# Patient Record
Sex: Female | Born: 1975 | Race: White | Hispanic: No | Marital: Married | Smoking: Never smoker
Health system: Southern US, Community
[De-identification: ages and names within clinical notes are randomized; demographics above are authoritative.]

## PROBLEM LIST (undated history)

## (undated) DIAGNOSIS — M549 Dorsalgia, unspecified: Secondary | ICD-10-CM

## (undated) DIAGNOSIS — R011 Cardiac murmur, unspecified: Secondary | ICD-10-CM

## (undated) DIAGNOSIS — R3129 Other microscopic hematuria: Secondary | ICD-10-CM

## (undated) HISTORY — DX: Dorsalgia, unspecified: M54.9

## (undated) HISTORY — DX: Other microscopic hematuria: R31.29

## (undated) HISTORY — PX: TUBAL LIGATION: SHX77

## (undated) HISTORY — DX: Cardiac murmur, unspecified: R01.1

---

## 2012-12-29 ENCOUNTER — Encounter: Payer: Self-pay | Admitting: Family Medicine

## 2013-01-08 ENCOUNTER — Encounter: Payer: Self-pay | Admitting: Family Medicine

## 2014-08-30 ENCOUNTER — Ambulatory Visit: Payer: Self-pay | Admitting: Family Medicine

## 2014-10-14 ENCOUNTER — Ambulatory Visit: Payer: Self-pay | Admitting: Gastroenterology

## 2014-11-22 DIAGNOSIS — D509 Iron deficiency anemia, unspecified: Secondary | ICD-10-CM | POA: Insufficient documentation

## 2015-02-08 ENCOUNTER — Other Ambulatory Visit: Payer: Self-pay | Admitting: Family Medicine

## 2015-02-09 ENCOUNTER — Other Ambulatory Visit: Payer: Self-pay | Admitting: Family Medicine

## 2015-02-09 DIAGNOSIS — Z1231 Encounter for screening mammogram for malignant neoplasm of breast: Secondary | ICD-10-CM

## 2015-02-17 ENCOUNTER — Ambulatory Visit: Payer: Self-pay

## 2015-02-18 ENCOUNTER — Ambulatory Visit
Admission: RE | Admit: 2015-02-18 | Discharge: 2015-02-18 | Disposition: A | Discharge status: Home / Self Care | Payer: Medicaid Other | Source: Ambulatory Visit | Attending: Family Medicine | Admitting: Family Medicine

## 2015-02-18 DIAGNOSIS — Z1231 Encounter for screening mammogram for malignant neoplasm of breast: Secondary | ICD-10-CM

## 2015-03-21 ENCOUNTER — Encounter: Payer: Self-pay | Admitting: *Deleted

## 2015-03-21 ENCOUNTER — Ambulatory Visit (INDEPENDENT_AMBULATORY_CARE_PROVIDER_SITE_OTHER): Payer: Medicaid Other | Admitting: Urology

## 2015-03-21 VITALS — BP 113/75 | HR 80 | Ht 65.0 in | Wt 182.2 lb

## 2015-03-21 DIAGNOSIS — T7840XA Allergy, unspecified, initial encounter: Secondary | ICD-10-CM | POA: Insufficient documentation

## 2015-03-21 DIAGNOSIS — N390 Urinary tract infection, site not specified: Secondary | ICD-10-CM | POA: Diagnosis not present

## 2015-03-21 DIAGNOSIS — R8281 Pyuria: Secondary | ICD-10-CM

## 2015-03-21 DIAGNOSIS — R312 Other microscopic hematuria: Secondary | ICD-10-CM

## 2015-03-21 DIAGNOSIS — R8271 Bacteriuria: Secondary | ICD-10-CM

## 2015-03-21 DIAGNOSIS — R3129 Other microscopic hematuria: Secondary | ICD-10-CM

## 2015-03-21 LAB — URINALYSIS, COMPLETE
Bilirubin, UA: NEGATIVE
GLUCOSE, UA: NEGATIVE
Leukocytes, UA: NEGATIVE
NITRITE UA: NEGATIVE
PROTEIN UA: NEGATIVE
RBC UA: NEGATIVE
Specific Gravity, UA: 1.025 (ref 1.005–1.030)
Urobilinogen, Ur: 1 mg/dL (ref 0.2–1.0)
pH, UA: 6 (ref 5.0–7.5)

## 2015-03-21 LAB — MICROSCOPIC EXAMINATION: Bacteria, UA: NONE SEEN

## 2015-03-21 NOTE — Progress Notes (Signed)
Patient ID: Tammy Pace, female   DOB: 08/10/1976, 39 y.o.   MRN: [id number]   03/21/2015 10:47 AM   Tammy BrickKatherine Pace 08/10/1976 [id number]  Referring provider: Titus MouldElizabeth Burney White, NP [address] [address] [place], [place] [id number]  Chief Complaint  Patient presents with  . Hematuria    referral from Doristine MangoElizabeth White NP    HPI: Tammy LeatherwoodKatherine is a 39 yo WF who was sent in consultation by Dr Cliffton AstersWhite for microhematuria found on a UA in March.   She had 8 RBC but also had pyuria and bacteruria.   She had no gross hematuria.  She has had no dysuria, urgency or frequency.  She has nocturia x 1.  She has no obstructive symptoms.  She has no history of stones or GU surgery.  She has some vaginal discharge.  Her UA today has 3-10 RBC's, 11-30 WBC's, 0-10 epis and many bacteria.  She has a history of iron deficiency anemia any there is a concern that the hematuria might be contributing.    PMH: Past Medical History  Diagnosis Date  . Microscopic hematuria     chronic  . Back pain   . Heart murmur     Surgical History: Past Surgical History  Procedure Laterality Date  . Tubal ligation      Home Medications:    Medication List       This list is accurate as of: 03/21/15 10:47 AM.  Always use your most recent med list.               ferrous sulfate 325 (65 FE) MG tablet  Take by mouth.     Norgestimate-Ethinyl Estradiol Triphasic 0.18/0.215/0.25 MG-35 MCG tablet  Take by mouth.        Allergies: No Known Allergies  Family History: Family History  Problem Relation Age of Onset  . Kidney cancer Neg Hx   . Prostate cancer Neg Hx   . Bladder Cancer Neg Hx   . Hypertension Mother   . Hypertension Father     Social History:  reports that she has never smoked. She does not have any smokeless tobacco history on file. She reports that she does not drink alcohol or use illicit drugs.   Review of Systems UROLOGY Frequent Urination?: Yes Hard to postpone urination?:  No Burning/pain with urination?: No Get up at night to urinate?: Yes Leakage of urine?: No Urine stream starts and stops?: No Trouble starting stream?: No Do you have to strain to urinate?: No Blood in urine?: Yes Urinary tract infection?: No Sexually transmitted disease?: No Injury to kidneys or bladder?: No Painful intercourse?: No Weak stream?: No Currently pregnant?: No Vaginal bleeding?: No Last menstrual period?: n Gastrointestinal Nausea?: No Vomiting?: No Indigestion/heartburn?: No Diarrhea?: No Constipation?: No Constitutional Fever: No Night sweats?: No Weight loss?: No Fatigue?: Yes Skin Skin rash/lesions?: No Itching?: No Eyes Blurred vision?: No Double vision?: No Ears/Nose/Throat Sore throat?: No Sinus problems?: No Hematologic/Lymphatic Swollen glands?: No Easy bruising?: No Cardiovascular Leg swelling?: No Chest pain?: No Respiratory Cough?: No Shortness of breath?: No Endocrine Excessive thirst?: Yes Musculoskeletal Back pain?: Yes Joint pain?: No Neurological Headaches?: No Dizziness?: No Psychologic Depression?: No Anxiety?: No   Physical Exam: BP 113/75 mmHg  Pulse 80  Ht 5\' 5"  (1.651 m)  Wt 182 lb 3.2 oz (82.645 kg)  BMI 30.32 kg/m2  LMP 02/28/2015  Constitutional:  Alert and oriented, No acute distress. Cardiovascular: No clubbing, cyanosis, or edema. Respiratory: Normal respiratory effort, no increased work  of breathing. GI: Abdomen is soft, nontender, nondistended, no abdominal masses GU: No CVA tenderness.  She has normal external genitalia.  The urethral meatus is normal but she has hypermobility with a urethrocele.  She has normal mucosa but has a moderate mucopurulent discharge.  There is no bladder tenderness or mass.  The uterus and cervix are unremarkable.  She has no adnexal masses. Skin: No rashes, bruises or suspicious lesions. Lymph: No cervical or inguinal adenopathy. Neurologic: Grossly intact, no focal  deficits, moving all 4 extremities. Psychiatric: Normal mood and affect.  Laboratory Data: No results found for: WBC, HGB, HCT, MCV, PLT  No results found for: CREATININE  No results found for: PSA  No results found for: TESTOSTERONE  No results found for: HGBA1C  Urinalysis No results found for: COLORURINE, APPEARANCEUR, LABSPEC, PHURINE, GLUCOSEU, HGBUR, BILIRUBINUR, KETONESUR, PROTEINUR, UROBILINOGEN, NITRITE, LEUKOCYTESUR    Cath PVR was 20ml.  Cath UA shows only 0-5 WBC and 0-2 RBC's with 0-10 EPI's  Pertinent Imaging: none  Assessment & Plan:  The hematuria and pyuria are secondary to her vaginal discharge and mild prolapse and she doesn't need to be evaluated for the hematuria.    She will return in 2-3 weeks for a repeat cath UA for confirmatory purposes.  1. Microscopic hematuria See about  - Urinalysis, Complete  2. Bacteriuria with pyuria See above.    No Follow-up on file.  Anner Crete, MD   CC: Dr. Kathrynn Running  [facility] [address] [place], [place] [address] [phone]

## 2015-03-21 NOTE — Progress Notes (Signed)
In and Out Catheterization  Patient is present today for a I & O catheterization due to micro hematuria. Patient was cleaned and prepped in a sterile fashion with betadine and Lidocaine 2% jelly was instilled into the urethra.  A 14FR cath was inserted no complications were noted , 20ml of urine return was noted, urine was yellow in color. A clean urine sample was collected for u/a. Bladder was drained  And catheter was removed with out difficulty.    Preformed by: Rupert Stackshelsea Mekhi Lascola, LPN

## 2015-04-11 ENCOUNTER — Ambulatory Visit: Payer: Medicaid Other
